# Patient Record
Sex: Female | Born: 1945 | Race: White | Hispanic: No | Marital: Married
Health system: Southern US, Community
[De-identification: ages and names within clinical notes are randomized; demographics above are authoritative.]

---

## 2005-02-25 ENCOUNTER — Emergency Department: Payer: Self-pay | Admitting: Emergency Medicine

## 2021-05-18 ENCOUNTER — Emergency Department
Admission: EM | Admit: 2021-05-18 | Discharge: 2021-05-18 | Disposition: A | Discharge status: Home / Self Care | Payer: Medicare (Managed Care) | Attending: Emergency Medicine | Admitting: Emergency Medicine

## 2021-05-18 ENCOUNTER — Emergency Department: Payer: Medicare (Managed Care)

## 2021-05-18 ENCOUNTER — Other Ambulatory Visit: Payer: Self-pay

## 2021-05-18 DIAGNOSIS — R7309 Other abnormal glucose: Secondary | ICD-10-CM | POA: Diagnosis not present

## 2021-05-18 DIAGNOSIS — R4182 Altered mental status, unspecified: Secondary | ICD-10-CM

## 2021-05-18 DIAGNOSIS — F0390 Unspecified dementia without behavioral disturbance: Secondary | ICD-10-CM | POA: Insufficient documentation

## 2021-05-18 DIAGNOSIS — E860 Dehydration: Secondary | ICD-10-CM

## 2021-05-18 LAB — COMPREHENSIVE METABOLIC PANEL
ALT: 17 U/L (ref 0–44)
AST: 33 U/L (ref 15–41)
Albumin: 3.1 g/dL — ABNORMAL LOW (ref 3.5–5.0)
Alkaline Phosphatase: 82 U/L (ref 38–126)
Anion gap: 12 (ref 5–15)
BUN: 34 mg/dL — ABNORMAL HIGH (ref 8–23)
CO2: 30 mmol/L (ref 22–32)
Calcium: 9.6 mg/dL (ref 8.9–10.3)
Chloride: 96 mmol/L — ABNORMAL LOW (ref 98–111)
Creatinine, Ser: 0.79 mg/dL (ref 0.44–1.00)
GFR, Estimated: >60 mL/min (ref >60)
Glucose, Bld: 130 mg/dL — ABNORMAL HIGH (ref 70–99)
Potassium: 3.7 mmol/L (ref 3.5–5.1)
Sodium: 138 mmol/L (ref 135–145)
Total Bilirubin: 1.5 mg/dL — ABNORMAL HIGH (ref 0.3–1.2)
Total Protein: 7.5 g/dL (ref 6.5–8.1)

## 2021-05-18 LAB — CBC WITH DIFFERENTIAL/PLATELET
Abs Immature Granulocytes: 0.01 10*3/uL (ref 0.00–0.07)
Basophils Absolute: 0 10*3/uL (ref 0.0–0.1)
Basophils Relative: 0 %
Eosinophils Absolute: 0.1 10*3/uL (ref 0.0–0.5)
Eosinophils Relative: 2 %
HCT: 46 % (ref 36.0–46.0)
Hemoglobin: 14.8 g/dL (ref 12.0–15.0)
Immature Granulocytes: 0 %
Lymphocytes Relative: 18 %
Lymphs Abs: 1.4 10*3/uL (ref 0.7–4.0)
MCH: 28.3 pg (ref 26.0–34.0)
MCHC: 32.2 g/dL (ref 30.0–36.0)
MCV: 88 fL (ref 80.0–100.0)
Monocytes Absolute: 0.9 10*3/uL (ref 0.1–1.0)
Monocytes Relative: 12 %
Neutro Abs: 5.2 10*3/uL (ref 1.7–7.7)
Neutrophils Relative %: 68 %
Platelets: 220 10*3/uL (ref 150–400)
RBC: 5.23 MIL/uL — ABNORMAL HIGH (ref 3.87–5.11)
RDW: 13.5 % (ref 11.5–15.5)
WBC: 7.7 10*3/uL (ref 4.0–10.5)
nRBC: 0 % (ref 0.0–0.2)

## 2021-05-18 LAB — URINALYSIS, COMPLETE (UACMP) WITH MICROSCOPIC
Bilirubin Urine: NEGATIVE
Glucose, UA: NEGATIVE mg/dL
Ketones, ur: 20 mg/dL — AB
Leukocytes,Ua: NEGATIVE
Nitrite: NEGATIVE
Protein, ur: 30 mg/dL — AB
Specific Gravity, Urine: >1.046 — ABNORMAL HIGH (ref 1.005–1.030)
pH: 5 (ref 5.0–8.0)

## 2021-05-18 LAB — BLOOD GAS, VENOUS
Acid-Base Excess: 7.3 mmol/L — ABNORMAL HIGH (ref 0.0–2.0)
Bicarbonate: 34.1 mmol/L — ABNORMAL HIGH (ref 20.0–28.0)
O2 Saturation: 67.3 %
Patient temperature: 37
pCO2, Ven: 55 mmHg (ref 44.0–60.0)
pH, Ven: 7.4 (ref 7.250–7.430)
pO2, Ven: 35 mmHg (ref 32.0–45.0)

## 2021-05-18 LAB — TROPONIN I (HIGH SENSITIVITY)
Troponin I (High Sensitivity): 7 ng/L (ref <18)
Troponin I (High Sensitivity): 6 ng/L (ref <18)

## 2021-05-18 LAB — CBG MONITORING, ED: Glucose-Capillary: 112 mg/dL — ABNORMAL HIGH (ref 70–99)

## 2021-05-18 MED ORDER — SODIUM CHLORIDE 0.9 % IV BOLUS
1000.0000 mL | Freq: Once | INTRAVENOUS | Status: AC
  Administered 2021-05-18: 1000 mL via INTRAVENOUS

## 2021-05-18 MED ORDER — IOHEXOL 350 MG/ML SOLN
75.0000 mL | Freq: Once | INTRAVENOUS | Status: AC | PRN
  Administered 2021-05-18: 75 mL via INTRAVENOUS

## 2021-05-18 NOTE — ED Provider Notes (Signed)
[facility] Provider Note    Event Date/Time   First MD Initiated Contact with Patient 05/18/21 1149     (approximate)   History   Altered Mental Status   HPI  Terri Walton is a 76 y.o. female with past medical history dementia, chronic constipation, DVT on Xarelto presents with altered mental status.  Per EMS, patient was at her baseline morning and then around 9 AM became altered decreased responsiveness.  Unable to obtain history of the patient.  History reviewed. No pertinent past medical history.  There are no problems to display for this patient.    Physical Exam  Triage Vital Signs: ED Triage Vitals  Enc Vitals Group     BP 05/18/21 1207 112/64     Pulse Rate 05/18/21 1207 74     Resp 05/18/21 1207 18     Temp 05/18/21 1207 98.7 F (37.1 C)     Temp Source 05/18/21 1207 Oral     SpO2 05/18/21 1207 96 %     Weight 05/18/21 1208 145 lb (65.8 kg)     Height 05/18/21 1208 4\' 9"  (1.448 m)     Head Circumference --      Peak Flow --      Pain Score --      Pain Loc --      Pain Edu? --      Excl. in GC? --     Most recent vital signs: Vitals:   05/18/21 1445 05/18/21 1530  BP:  129/76  Pulse: 80 75  Resp: (!) 23 19  Temp:    SpO2: 96% 96%     General: Awake, eyes open, no response CV:  Good peripheral perfusion.  No lower extremity edema Resp:  Normal effort.  Respirations even and unlabored Abd:  No distention.  Nontender Neuro:             Face is symmetric, pupils reactive and normal size, no response to pain in all 4 extremities Other:     ED Results / Procedures / Treatments  Labs (all labs ordered are listed, but only abnormal results are displayed) Labs Reviewed  COMPREHENSIVE METABOLIC PANEL - Abnormal; Notable for the following components:      Result Value   Chloride 96 (*)    Glucose, Bld 130 (*)    BUN 34 (*)    Albumin 3.1 (*)    Total Bilirubin 1.5 (*)    All other components within normal limits  CBC  WITH DIFFERENTIAL/PLATELET - Abnormal; Notable for the following components:   RBC 5.23 (*)    All other components within normal limits  BLOOD GAS, VENOUS - Abnormal; Notable for the following components:   Bicarbonate 34.1 (*)    Acid-Base Excess 7.3 (*)    All other components within normal limits  CBG MONITORING, ED - Abnormal; Notable for the following components:   Glucose-Capillary 112 (*)    All other components within normal limits  URINALYSIS, COMPLETE (UACMP) WITH MICROSCOPIC  TROPONIN I (HIGH SENSITIVITY)  TROPONIN I (HIGH SENSITIVITY)     EKG  EKG interpretation performed by myself: NSR, nml axis, nml intervals, no acute ischemic changes    RADIOLOGY I reviewed the CT angio of the head and neck, do not see any large hemorrhage in the brain   PROCEDURES:  Critical Care performed: No  .1-3 Lead EKG Interpretation Performed by: 07/16/21, MD Authorized by: Georga Hacking, MD  Interpretation: normal     ECG rate assessment: normal     Ectopy: none     Conduction: normal    The patient is on the cardiac monitor to evaluate for evidence of arrhythmia and/or significant heart rate changes.   MEDICATIONS ORDERED IN ED: Medications  iohexol (OMNIPAQUE) 350 MG/ML injection 75 mL (75 mLs Intravenous Contrast Given 05/18/21 1245)     IMPRESSION / MDM / ASSESSMENT AND PLAN / ED COURSE  I reviewed the triage vital signs and the nursing notes.                              Differential diagnosis includes, but is not limited to, seizure, hypoglycemia, CVA, intracranial hemorrhage, metabolic abnormality, sepsis  Patient is a 76 year old female with history of dementia who presents with altered level consciousness.  Apparently she was at her baseline which normal she is able to respond and talk and have a conversation at 9 AM today.  On arrival to the ED patient is minimally responsive although she is awake and does track she has minimal pain response in  all 4 extremities.  No facial asymmetry, stroke alert not called because there is no focal deficits.  Accu-Chek within normal limits.  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  Labs are all reassuring, no leukocytosis or anemia, left lites within normal limits troponin negative, EKG without hypercarbia.  EKG without concerning features.  On repeat evaluation patient is now awake and able to follow commands.  She is able to tell me she is not in pain.  She has equal strength in all 4 extremities.  She is with her daughter who tells me that this is now her baseline.  She had an episode over the weekend as well where she had decreased responsiveness.  Question whether she has seizures and this postictal period today.  Have conducted first-time seizure work-up and without any witnessed generalized tonic-clonic seizure activity would not want to start her on meds.  Given she is back to baseline with an otherwise reassuring work-up I do think she is appropriate to go back to her facility  At the time of signout she is pending a UA.      FINAL CLINICAL IMPRESSION(S) / ED DIAGNOSES   Final diagnoses:  Altered mental status, unspecified altered mental status type     Rx / DC Orders   ED Discharge Orders     None        Note:  This document was prepared using Dragon voice recognition software and may include unintentional dictation errors.   Georga Hacking, MD 05/18/21 [phone]

## 2021-05-18 NOTE — Discharge Instructions (Addendum)
Your CAT scan of the brain did not show any obvious abnormality.  It does look like some of the vessels in your brain have atherosclerosis so if you are not already please start taking a baby aspirin daily.  Is possible that you may have had a seizure, please follow-up with neurology as an outpatient.

## 2021-05-18 NOTE — ED Notes (Signed)
Called and Spoke with CJ, Public librarian at [employer] [facility] [employer]. Updated her on work up, patient status, and VS. She is aware that patient will be returning to the facility. EMS transport has been called.

## 2021-05-18 NOTE — ED Notes (Addendum)
Resting in bed with eyes closed. Pt will not open eyes to command. With eyes closed, Verbally responded when I introduced myself to her and was able to tell me her name and answer orientation questions. Disorientated to place and time. Follows commands in the upper extremities. Bilateral hand grasps weak. Light reflexive response with nail down the bottom of the foot. Family states that her orientation waxes and wains. Skin p/w/d. RR even and non-labored.

## 2021-05-18 NOTE — ED Notes (Signed)
Bladder scan 121ml

## 2021-05-18 NOTE — ED Notes (Signed)
Pt now more awake, talking. Pt alert oriented to person, place, dementia at baseline. Denies any complaints at this time. Daughter at the bedside.

## 2021-05-18 NOTE — ED Notes (Signed)
Called EMS for transport back to [facility]

## 2021-05-18 NOTE — ED Triage Notes (Signed)
Pt BIB EMS, report received by Charge nurse, from [facility], not responsive as she normally is. LWN at 0930AM. Per EMS, pt head leaning to the left side is chronic.

## 2021-05-18 NOTE — ED Notes (Signed)
Pt's daughter at bedside, states pt was "not responsive, not talking very much last Saturday then Sunday and Monday she was talking a little bit and she was hurting everywhere. She suddenly became unresponsive today".

## 2021-05-18 NOTE — ED Notes (Signed)
E-signature pad unavailable - Pts relative verbalized understanding of D/C information - no additional concerns at this time.

## 2021-09-09 DEATH — deceased

## 2023-05-24 IMAGING — CT CT ANGIO HEAD-NECK (W OR W/O PERF)
1 of 15 series · 5 of 34 positions shown · IV contrast (APPLIED)
Comparison: Head February 17, 2015.
COMPARISON: Head February 17, 2015.

Addendum:
CLINICAL DATA: Stroke, follow up

EXAM:
CT ANGIOGRAPHY HEAD AND NECK
TECHNIQUE: Multidetector CT imaging of the head and neck was performed using
the standard protocol during bolus administration of intravenous
contrast. Multiplanar CT image reconstructions and MIPs were
obtained to evaluate the vascular anatomy. Carotid stenosis
measurements (when applicable) are obtained utilizing NASCET
criteria, using the distal internal carotid diameter as the
denominator.

[Series 12: cta head only axial · axial · 0.37mm/px · z∈[-261,-129]mm · 5 of 481 slices shown]
[im 81/481  soft-tissue]
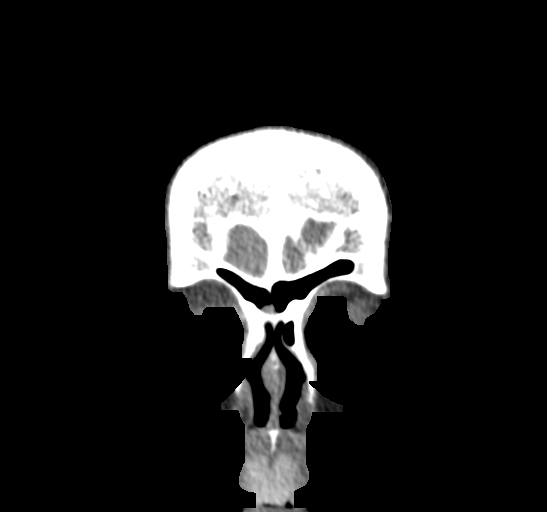
[im 161/481  bone]
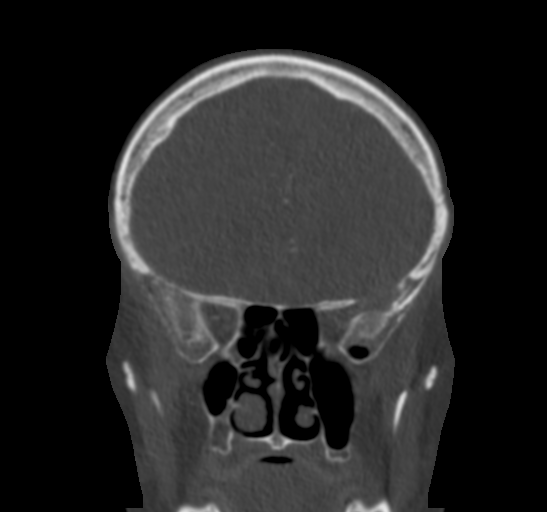
[im 241/481  soft-tissue]
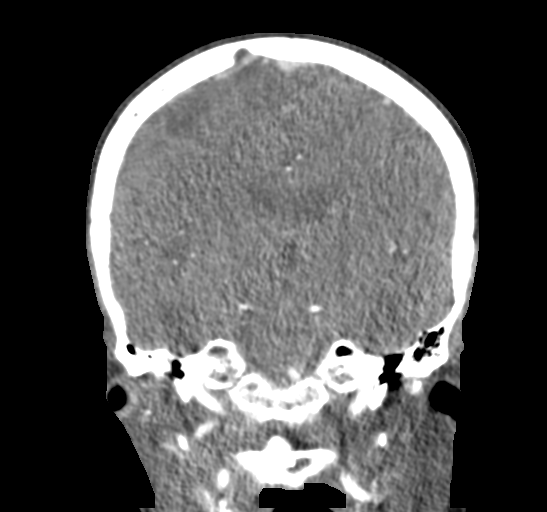
[im 321/481  bone]
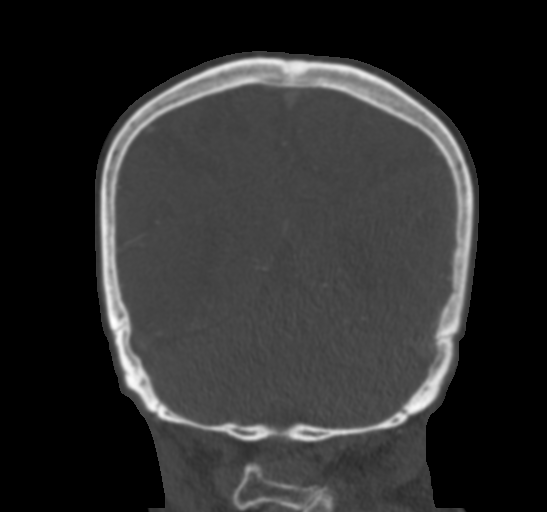
[im 401/481  soft-tissue]
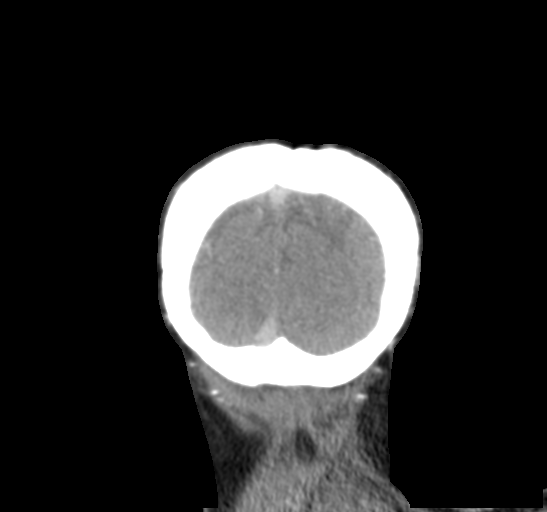

[5 of 34 positions shown; findings below may reference images not displayed]

RADIATION DOSE REDUCTION: This exam was performed according to the
departmental dose-optimization program which includes automated
exposure control, adjustment of the mA and/or kV according to
patient size and/or use of iterative reconstruction technique.

CONTRAST:  75mL OMNIPAQUE IOHEXOL 350 MG/ML SOLN
FINDINGS: Severely limited study due to difficult patient positioning/kyphosis
and motion/streak artifact. Within this limitation:

CT HEAD FINDINGS

Brain: Within limitation above, no visible evidence of acute large
vascular territory infarct. No definite evidence of acute
hemorrhage, mass lesion, midline shift.

Vascular: Major arterial flow voids are maintained skull base.

Skull: Evidence of acute fracture.

Sinuses: Sinuses are largely clear.

Orbits: Unremarkable orbits on limited assessment.

Review of the MIP images confirms the above findings

CTA NECK FINDINGS

Aortic arch: Great vessel origins are patent.

Right carotid system: Patent. Atherosclerosis at the carotid
bifurcation without significant (greater than 50%) stenosis.

Left carotid system: Atherosclerosis at the carotid bifurcation and
involving the proximal ICA without significant (greater than 50%)
stenosis.

Vertebral arteries: Mild limited visualization due to motion.
Bilateral vertebral arteries are patent. Multifocal stenosis of the
V2 vertebral arteries bilateral, potentially hemodynamically
significant. Findings are likely secondary to combination of
atherosclerosis and mass effect from adjacent degenerative
osteophytes.

Skeleton: Multilevel degenerative change of the spine. Exaggerated
thoracic kyphosis. No obvious acute abnormality on limited
assessment.

Other neck: No evidence of acute abnormality on limited assessment.

Upper chest: Expiratory changes in the visualized lung apices
without definite acute abnormality.

Review of the MIP images confirms the above findings

CTA HEAD FINDINGS

Anterior circulation: Bilateral intracranial ICAs and proximal MCAs
and ACAs are patent. Bilateral intracranial ICA atherosclerosis with
potentially severe stenosis of the right paraclinoid ICA. Poor
visualization of distal MCA and ACAs due to motion. The ACAs appear
small diffusely.

Posterior circulation: Small vertebrobasilar system with left fetal
type PCA, anatomic variant. Small right posterior communicating
artery. Evaluation is limited, but suspect severe stenosis of the
distal basilar artery. Bilateral PCAs are patent.

Venous sinuses: As permitted by contrast timing, patent.

Anatomic variants: Detailed above.

Review of the MIP images confirms the above findings
IMPRESSION: Severely limited study due to difficult patient positioning/kyphosis
and motion/streak artifact. Within these limitations:

1. No obvious evidence of acute intracranial abnormality. Underlying
pathology could be easily obscured given limitations and MRI could
provide more sensitive evaluation for acute abnormality if the
patient is able.
2. Limited arterial evaluation without evidence of large vessel
occlusion.
3. Suspected severe stenosis of the distal basilar artery.
4. Possibly severe right paraclinoid ICA stenosis, not well
visualized.
5. Possibly significant stenosis of the V2 vertebral arteries
bilaterally with diffusely small vertebrobasilar system.

ADDENDUM:
Correction to the findings: The skull section should read NO
evidence of acute fracture.

Discussed with [REDACTED], NP at [DATE].

*** End of Addendum ***
RADIATION DOSE REDUCTION: This exam was performed according to the
departmental dose-optimization program which includes automated
exposure control, adjustment of the mA and/or kV according to
patient size and/or use of iterative reconstruction technique.

CONTRAST:  75mL OMNIPAQUE IOHEXOL 350 MG/ML SOLN
FINDINGS: Severely limited study due to difficult patient positioning/kyphosis
and motion/streak artifact. Within this limitation:

CT HEAD FINDINGS

Brain: Within limitation above, no visible evidence of acute large
vascular territory infarct. No definite evidence of acute
hemorrhage, mass lesion, midline shift.

Vascular: Major arterial flow voids are maintained skull base.

Skull: Evidence of acute fracture.

Sinuses: Sinuses are largely clear.

Orbits: Unremarkable orbits on limited assessment.

Review of the MIP images confirms the above findings

CTA NECK FINDINGS

Aortic arch: Great vessel origins are patent.

Right carotid system: Patent. Atherosclerosis at the carotid
bifurcation without significant (greater than 50%) stenosis.

Left carotid system: Atherosclerosis at the carotid bifurcation and
involving the proximal ICA without significant (greater than 50%)
stenosis.

Vertebral arteries: Mild limited visualization due to motion.
Bilateral vertebral arteries are patent. Multifocal stenosis of the
V2 vertebral arteries bilateral, potentially hemodynamically
significant. Findings are likely secondary to combination of
atherosclerosis and mass effect from adjacent degenerative
osteophytes.

Skeleton: Multilevel degenerative change of the spine. Exaggerated
thoracic kyphosis. No obvious acute abnormality on limited
assessment.

Other neck: No evidence of acute abnormality on limited assessment.

Upper chest: Expiratory changes in the visualized lung apices
without definite acute abnormality.

Review of the MIP images confirms the above findings

CTA HEAD FINDINGS

Anterior circulation: Bilateral intracranial ICAs and proximal MCAs
and ACAs are patent. Bilateral intracranial ICA atherosclerosis with
potentially severe stenosis of the right paraclinoid ICA. Poor
visualization of distal MCA and ACAs due to motion. The ACAs appear
small diffusely.

Posterior circulation: Small vertebrobasilar system with left fetal
type PCA, anatomic variant. Small right posterior communicating
artery. Evaluation is limited, but suspect severe stenosis of the
distal basilar artery. Bilateral PCAs are patent.

Venous sinuses: As permitted by contrast timing, patent.

Anatomic variants: Detailed above.

Review of the MIP images confirms the above findings
IMPRESSION: Severely limited study due to difficult patient positioning/kyphosis
and motion/streak artifact. Within these limitations:

1. No obvious evidence of acute intracranial abnormality. Underlying
pathology could be easily obscured given limitations and MRI could
provide more sensitive evaluation for acute abnormality if the
patient is able.
2. Limited arterial evaluation without evidence of large vessel
occlusion.
3. Suspected severe stenosis of the distal basilar artery.
4. Possibly severe right paraclinoid ICA stenosis, not well
visualized.
5. Possibly significant stenosis of the V2 vertebral arteries
bilaterally with diffusely small vertebrobasilar system.
